# Patient Record
Sex: Male | Born: 1985 | Race: Black or African American | Hispanic: No | Marital: Single | Smoking: Never smoker
Health system: Southern US, Community
[De-identification: ages and names within clinical notes are randomized; demographics above are authoritative.]

## PROBLEM LIST (undated history)

## (undated) DIAGNOSIS — J45909 Unspecified asthma, uncomplicated: Secondary | ICD-10-CM

---

## 2009-07-13 ENCOUNTER — Emergency Department (HOSPITAL_COMMUNITY): Admission: EM | Admit: 2009-07-13 | Discharge: 2009-07-13 | Payer: Self-pay | Admitting: Emergency Medicine

## 2009-08-09 ENCOUNTER — Emergency Department (HOSPITAL_COMMUNITY): Admission: EM | Admit: 2009-08-09 | Discharge: 2009-08-09 | Payer: Self-pay | Admitting: Emergency Medicine

## 2009-08-18 ENCOUNTER — Emergency Department (HOSPITAL_COMMUNITY): Admission: EM | Admit: 2009-08-18 | Discharge: 2009-08-18 | Payer: Self-pay | Admitting: Emergency Medicine

## 2015-10-14 ENCOUNTER — Emergency Department (HOSPITAL_COMMUNITY): Payer: Self-pay

## 2015-10-14 ENCOUNTER — Encounter (HOSPITAL_COMMUNITY): Payer: Self-pay | Admitting: Emergency Medicine

## 2015-10-14 ENCOUNTER — Emergency Department (HOSPITAL_COMMUNITY)
Admission: EM | Admit: 2015-10-14 | Discharge: 2015-10-14 | Disposition: A | Discharge status: Home / Self Care | Payer: Self-pay | Attending: Emergency Medicine | Admitting: Emergency Medicine

## 2015-10-14 DIAGNOSIS — Y92410 Unspecified street and highway as the place of occurrence of the external cause: Secondary | ICD-10-CM | POA: Insufficient documentation

## 2015-10-14 DIAGNOSIS — J45909 Unspecified asthma, uncomplicated: Secondary | ICD-10-CM | POA: Insufficient documentation

## 2015-10-14 DIAGNOSIS — S39012A Strain of muscle, fascia and tendon of lower back, initial encounter: Secondary | ICD-10-CM | POA: Insufficient documentation

## 2015-10-14 DIAGNOSIS — Y999 Unspecified external cause status: Secondary | ICD-10-CM | POA: Insufficient documentation

## 2015-10-14 DIAGNOSIS — Y9389 Activity, other specified: Secondary | ICD-10-CM | POA: Insufficient documentation

## 2015-10-14 HISTORY — DX: Unspecified asthma, uncomplicated: J45.909

## 2015-10-14 MED ORDER — ALBUTEROL SULFATE HFA 108 (90 BASE) MCG/ACT IN AERS
2.0000 | INHALATION_SPRAY | RESPIRATORY_TRACT | Status: DC | PRN
  Administered 2015-10-14: 2 via RESPIRATORY_TRACT
  Filled 2015-10-14: qty 6.7

## 2015-10-14 MED ORDER — IBUPROFEN 800 MG PO TABS: 800.0000 mg | ORAL_TABLET | Freq: Three times a day (TID) | ORAL | Status: DC | PRN

## 2015-10-14 MED ORDER — CYCLOBENZAPRINE HCL 10 MG PO TABS: 10.0000 mg | ORAL_TABLET | Freq: Three times a day (TID) | ORAL | Status: DC | PRN
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
## 2015-10-14 NOTE — Discharge Instructions (Signed)
Return here as needed.  Follow-up with a primary care doctor.  Your x-rays did not show any abnormality

## 2016-12-25 DIAGNOSIS — Z23 Encounter for immunization: Secondary | ICD-10-CM | POA: Diagnosis not present

## 2017-03-21 ENCOUNTER — Ambulatory Visit (INDEPENDENT_AMBULATORY_CARE_PROVIDER_SITE_OTHER): Payer: 59 | Admitting: Emergency Medicine

## 2017-03-21 VITALS — BP 137/86 | HR 89 | Temp 97.9°F | Resp 17 | Ht 71.2 in | Wt 394.0 lb

## 2017-03-21 DIAGNOSIS — K122 Cellulitis and abscess of mouth: Secondary | ICD-10-CM | POA: Insufficient documentation

## 2017-03-21 MED ORDER — PREDNISONE 20 MG PO TABS: 40.0000 mg | ORAL_TABLET | Freq: Every day | ORAL | 0 refills | Status: AC

## 2017-03-21 NOTE — Patient Instructions (Addendum)
   IF you received an x-ray today, you will receive an invoice from [facility]. Please contact [facility] at [phone] with questions or concerns regarding your invoice.   IF you received labwork today, you will receive an invoice from [facility]. Please contact [facility] at [phone] with questions or concerns regarding your invoice.   Our billing staff will not be able to assist you with questions regarding bills from these companies.  You will be contacted with the lab results as soon as they are available. The fastest way to get your results is to activate your My Chart account. Instructions are located on the last page of this paperwork. If you have not heard from us regarding the results in 2 weeks, please contact this office.      Uvulitis Uvulitis is infection or inflammation of the uvula. The uvula is the small, finger-like piece of tissue that hangs down at the back of your throat. What are the causes? This condition may be caused by:  An infection in the mouth or throat. This is the most common cause.  Trauma to the uvula. Causes of trauma include burning your mouth and heavy snoring.  Fluid build-up (edema). Edema can be triggered be an allergic reaction. Uvulitis that is caused by edema is called Quincke disease.  Inhaling irritants, such as chemical agents, smoke, or steam. What are the signs or symptoms? Symptoms of this condition depend on the cause. Symptoms of uvulitis that is caused by infection include:  Red, swollen uvula.  Sore throat.  Fever.  Headache.  Swollen neck glands. Symptoms of uvulitis that is caused by trauma, edema, or irritation include:  Red, swollen uvula.  Sore throat.  Trouble swallowing.  Choking or gagging.  Trouble breathing. How is this diagnosed? This condition is diagnosed with a physical exam. You also may have tests, such as a throat culture and blood tests. How is this treated? Treatment for  this condition depends on the cause. Treatment may involve:  Antibiotic medicine. Antibiotics may be prescribed if a bacterial infection is the cause.  Steroid medicine. Steroids may be given if edema is the cause.  Surgery to remove part of the uvula (partial uvulectomy). Follow these instructions at home:  Rest as much as possible until your condition improves.  Drink enough fluid to keep your urine clear or pale yellow.  Take over-the-counter and prescription medicines only as told by your health care provider.  If you were prescribed an antibiotic medicine, take it as told by your health care provider. Do not stop taking the antibiotic even if you start to feel better.  Use a cool-mist humidifier to ease irritation in your throat.  While your throat is sore:  Eat soft foods or drink liquids, such as soup.  Gargle with a salt-water mixture 3-4 times per day or as needed. To make a salt-water mixture, completely dissolve -1 tsp of salt in 1 cup of warm water.  Keep all follow-up visits as told by your health care provider. This is important. Contact a health care provider if:  You have a fever.  You have trouble eating.  Your symptoms do not get better.  Your symptoms come back after treatment. Get help right away if:  You have trouble breathing.  You have trouble swallowing. This information is not intended to replace advice given to you by your health care provider. Make sure you discuss any questions you have with your health care provider. Document Released: 06/25/2004 Document Revised:   07/18/2016 Document Reviewed: 02/05/2015 Elsevier Interactive Patient Education  2017 Elsevier Inc.  

## 2017-03-21 NOTE — Progress Notes (Signed)
Lawrence James 31 y.o.   Chief Complaint  Patient presents with  . Sore Throat    2 months ago, not having one now. Sometimes Uvula swells up    HISTORY OF PRESENT ILLNESS: This is a 31 y.o. male with h/o recurrent episodes of uvulitis (spontaneously resolving) having one now for the past month; denies trouble swallowing or SOB; denies fever, chills, or any other significant symptoms.  HPI   Prior to Admission medications   Medication Sig Start Date End Date Taking? Authorizing Provider  albuterol (PROVENTIL HFA;VENTOLIN HFA) 108 (90 Base) MCG/ACT inhaler Inhale 2 puffs into the lungs every 4 (four) hours as needed for wheezing or shortness of breath.   Yes Historical Provider, MD  predniSONE (DELTASONE) 20 MG tablet Take 2 tablets (40 mg total) by mouth daily with breakfast. 03/21/17 03/26/17  Georgina Quint, MD    No Known Allergies  Patient Active Problem List   Diagnosis Date Noted  . Uvulitis 03/21/2017    Past Medical History:  Diagnosis Date  . Asthma     History reviewed. No pertinent surgical history.  Social History   Social History  . Marital status: Single    Spouse name: N/A  . Number of children: N/A  . Years of education: N/A   Occupational History  . Not on file.   Social History Main Topics  . Smoking status: Never Smoker  . Smokeless tobacco: Never Used  . Alcohol use Yes     Comment: occ  . Drug use: No  . Sexual activity: Not on file   Other Topics Concern  . Not on file   Social History Narrative  . No narrative on file    Family History  Problem Relation Age of Onset  . Arthritis Maternal Grandmother      Review of Systems  Constitutional: Negative.  Negative for chills, diaphoresis, fever and malaise/fatigue.  HENT: Positive for sore throat. Negative for congestion, nosebleeds and sinus pain.   Eyes: Negative.   Respiratory: Negative.  Negative for cough, shortness of breath and wheezing.   Cardiovascular: Negative.   Negative for chest pain and palpitations.  Gastrointestinal: Negative for abdominal pain, diarrhea, nausea and vomiting.  Genitourinary: Negative.   Musculoskeletal: Negative.   Skin: Negative for rash.  Neurological: Negative for dizziness and headaches.  Endo/Heme/Allergies: Negative.   All other systems reviewed and are negative.  Vitals:   03/21/17 1033  BP: 137/86  Pulse: 89  Resp: 17  Temp: 97.9 F (36.6 C)     Physical Exam  Constitutional: He is oriented to person, place, and time. He appears well-developed and well-nourished.  HENT:  Head: Normocephalic and atraumatic.  Mouth/Throat: Uvula is midline. Uvula swelling present. Posterior oropharyngeal edema and posterior oropharyngeal erythema present. No oropharyngeal exudate or tonsillar abscesses.  Eyes: Conjunctivae and EOM are normal. Pupils are equal, round, and reactive to light.  Neck: Normal range of motion. Neck supple. No JVD present.  Cardiovascular: Normal rate, regular rhythm and normal heart sounds.   Pulmonary/Chest: Effort normal and breath sounds normal.  Abdominal: Soft. There is no tenderness.  Musculoskeletal: Normal range of motion.  Lymphadenopathy:    He has no cervical adenopathy.  Neurological: He is alert and oriented to person, place, and time. No sensory deficit. He exhibits normal muscle tone.  Skin: Skin is warm and dry. Capillary refill takes less than 2 seconds.  Psychiatric: He has a normal mood and affect. His behavior is normal.  Vitals reviewed.  ASSESSMENT & PLAN:  Collie was seen today for sore throat.  Diagnoses and all orders for this visit:  Uvulitis Comments: recurrent  Other orders -     predniSONE (DELTASONE) 20 MG tablet; Take 2 tablets (40 mg total) by mouth daily with breakfast.    Patient Instructions       IF you received an x-ray today, you will receive an invoice from [facility]. Please contact [facility] at [phone] with  questions or concerns regarding your invoice.   IF you received labwork today, you will receive an invoice from [facility]. Please contact [facility] at [phone] with questions or concerns regarding your invoice.   Our billing staff will not be able to assist you with questions regarding bills from these companies.  You will be contacted with the lab results as soon as they are available. The fastest way to get your results is to activate your My Chart account. Instructions are located on the last page of this paperwork. If you have not heard from [place] regarding the results in 2 weeks, please contact this office.     Uvulitis Uvulitis is infection or inflammation of the uvula. The uvula is the small, finger-like piece of tissue that hangs down at the back of your throat. What are the causes? This condition may be caused by:  An infection in the mouth or throat. This is the most common cause.  Trauma to the uvula. Causes of trauma include burning your mouth and heavy snoring.  Fluid build-up (edema). Edema can be triggered be an allergic reaction. Uvulitis that is caused by edema is called Quincke disease.  Inhaling irritants, such as chemical agents, smoke, or steam. What are the signs or symptoms? Symptoms of this condition depend on the cause. Symptoms of uvulitis that is caused by infection include:  Red, swollen uvula.  Sore throat.  Fever.  Headache.  Swollen neck glands. Symptoms of uvulitis that is caused by trauma, edema, or irritation include:  Red, swollen uvula.  Sore throat.  Trouble swallowing.  Choking or gagging.  Trouble breathing. How is this diagnosed? This condition is diagnosed with a physical exam. You also may have tests, such as a throat culture and blood tests. How is this treated? Treatment for this condition depends on the cause. Treatment may involve:  Antibiotic medicine. Antibiotics may be prescribed if a bacterial infection is the  cause.  Steroid medicine. Steroids may be given if edema is the cause.  Surgery to remove part of the uvula (partial uvulectomy). Follow these instructions at home:  Rest as much as possible until your condition improves.  Drink enough fluid to keep your urine clear or pale yellow.  Take over-the-counter and prescription medicines only as told by your health care provider.  If you were prescribed an antibiotic medicine, take it as told by your health care provider. Do not stop taking the antibiotic even if you start to feel better.  Use a cool-mist humidifier to ease irritation in your throat.  While your throat is sore:  Eat soft foods or drink liquids, such as soup.  Gargle with a salt-water mixture 3-4 times per day or as needed. To make a salt-water mixture, completely dissolve -1 tsp of salt in 1 cup of warm water.  Keep all follow-up visits as told by your health care provider. This is important. Contact a health care provider if:  You have a fever.  You have trouble eating.  Your symptoms do not get better.  Your symptoms come back after treatment. Get help right away if:  You have trouble breathing.  You have trouble swallowing. This information is not intended to replace advice given to you by your health care provider. Make sure you discuss any questions you have with your health care provider. Document Released: 06/25/2004 Document Revised: 07/18/2016 Document Reviewed: 02/05/2015 Elsevier Interactive Patient Education  2017 Elsevier Inc.     Edwina Barth, MD [facility]

## 2019-11-30 ENCOUNTER — Other Ambulatory Visit: Payer: Self-pay

## 2019-11-30 ENCOUNTER — Emergency Department (HOSPITAL_BASED_OUTPATIENT_CLINIC_OR_DEPARTMENT_OTHER)
Admission: EM | Admit: 2019-11-30 | Discharge: 2019-11-30 | Disposition: A | Discharge status: Home / Self Care | Payer: No Typology Code available for payment source | Attending: Emergency Medicine | Admitting: Emergency Medicine

## 2019-11-30 ENCOUNTER — Encounter (HOSPITAL_BASED_OUTPATIENT_CLINIC_OR_DEPARTMENT_OTHER): Payer: Self-pay | Admitting: *Deleted

## 2019-11-30 ENCOUNTER — Emergency Department (HOSPITAL_BASED_OUTPATIENT_CLINIC_OR_DEPARTMENT_OTHER): Payer: No Typology Code available for payment source

## 2019-11-30 DIAGNOSIS — M79604 Pain in right leg: Secondary | ICD-10-CM | POA: Diagnosis present

## 2019-11-30 DIAGNOSIS — Y92410 Unspecified street and highway as the place of occurrence of the external cause: Secondary | ICD-10-CM | POA: Diagnosis not present

## 2019-11-30 DIAGNOSIS — Z79899 Other long term (current) drug therapy: Secondary | ICD-10-CM | POA: Insufficient documentation

## 2019-11-30 DIAGNOSIS — J45909 Unspecified asthma, uncomplicated: Secondary | ICD-10-CM | POA: Insufficient documentation

## 2019-11-30 DIAGNOSIS — Y990 Civilian activity done for income or pay: Secondary | ICD-10-CM | POA: Diagnosis not present

## 2019-11-30 DIAGNOSIS — M79631 Pain in right forearm: Secondary | ICD-10-CM | POA: Insufficient documentation

## 2019-11-30 DIAGNOSIS — Y939 Activity, unspecified: Secondary | ICD-10-CM | POA: Diagnosis not present

## 2019-11-30 DIAGNOSIS — T148XXA Other injury of unspecified body region, initial encounter: Secondary | ICD-10-CM | POA: Insufficient documentation

## 2019-11-30 MED ORDER — METHOCARBAMOL 500 MG PO TABS: 500.0000 mg | ORAL_TABLET | Freq: Two times a day (BID) | ORAL | 0 refills | Status: AC
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
## 2019-11-30 NOTE — ED Notes (Signed)
ED Provider at bedside. 

## 2019-11-30 NOTE — ED Notes (Signed)
Patient transported to X-ray 
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
## 2019-12-06 ENCOUNTER — Emergency Department (HOSPITAL_BASED_OUTPATIENT_CLINIC_OR_DEPARTMENT_OTHER)
Admission: EM | Admit: 2019-12-06 | Discharge: 2019-12-06 | Disposition: A | Discharge status: Home / Self Care | Payer: No Typology Code available for payment source | Attending: Emergency Medicine | Admitting: Emergency Medicine

## 2019-12-06 ENCOUNTER — Encounter (HOSPITAL_BASED_OUTPATIENT_CLINIC_OR_DEPARTMENT_OTHER): Payer: Self-pay | Admitting: Emergency Medicine

## 2019-12-06 ENCOUNTER — Emergency Department (HOSPITAL_BASED_OUTPATIENT_CLINIC_OR_DEPARTMENT_OTHER): Payer: 59 | Attending: Emergency Medicine

## 2019-12-06 ENCOUNTER — Other Ambulatory Visit: Payer: Self-pay

## 2019-12-06 DIAGNOSIS — S3992XA Unspecified injury of lower back, initial encounter: Secondary | ICD-10-CM | POA: Diagnosis present

## 2019-12-06 DIAGNOSIS — J45909 Unspecified asthma, uncomplicated: Secondary | ICD-10-CM | POA: Diagnosis not present

## 2019-12-06 DIAGNOSIS — Y9289 Other specified places as the place of occurrence of the external cause: Secondary | ICD-10-CM | POA: Diagnosis not present

## 2019-12-06 DIAGNOSIS — S39012A Strain of muscle, fascia and tendon of lower back, initial encounter: Secondary | ICD-10-CM | POA: Diagnosis not present

## 2019-12-06 DIAGNOSIS — Z79899 Other long term (current) drug therapy: Secondary | ICD-10-CM | POA: Insufficient documentation

## 2019-12-06 DIAGNOSIS — Y999 Unspecified external cause status: Secondary | ICD-10-CM | POA: Insufficient documentation

## 2019-12-06 DIAGNOSIS — Y9389 Activity, other specified: Secondary | ICD-10-CM | POA: Insufficient documentation

## 2019-12-06 MED ORDER — CYCLOBENZAPRINE HCL 10 MG PO TABS: 10.0000 mg | ORAL_TABLET | Freq: Two times a day (BID) | ORAL | 0 refills | Status: AC | PRN
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
## 2020-10-29 IMAGING — DX DG FOREARM 2V*R*
3 series · 3 of 3 positions shown · non-contrast
Comparison: None.

CLINICAL DATA: Pain

EXAM:
RIGHT FOREARM - 2 VIEW

[forearm ap]
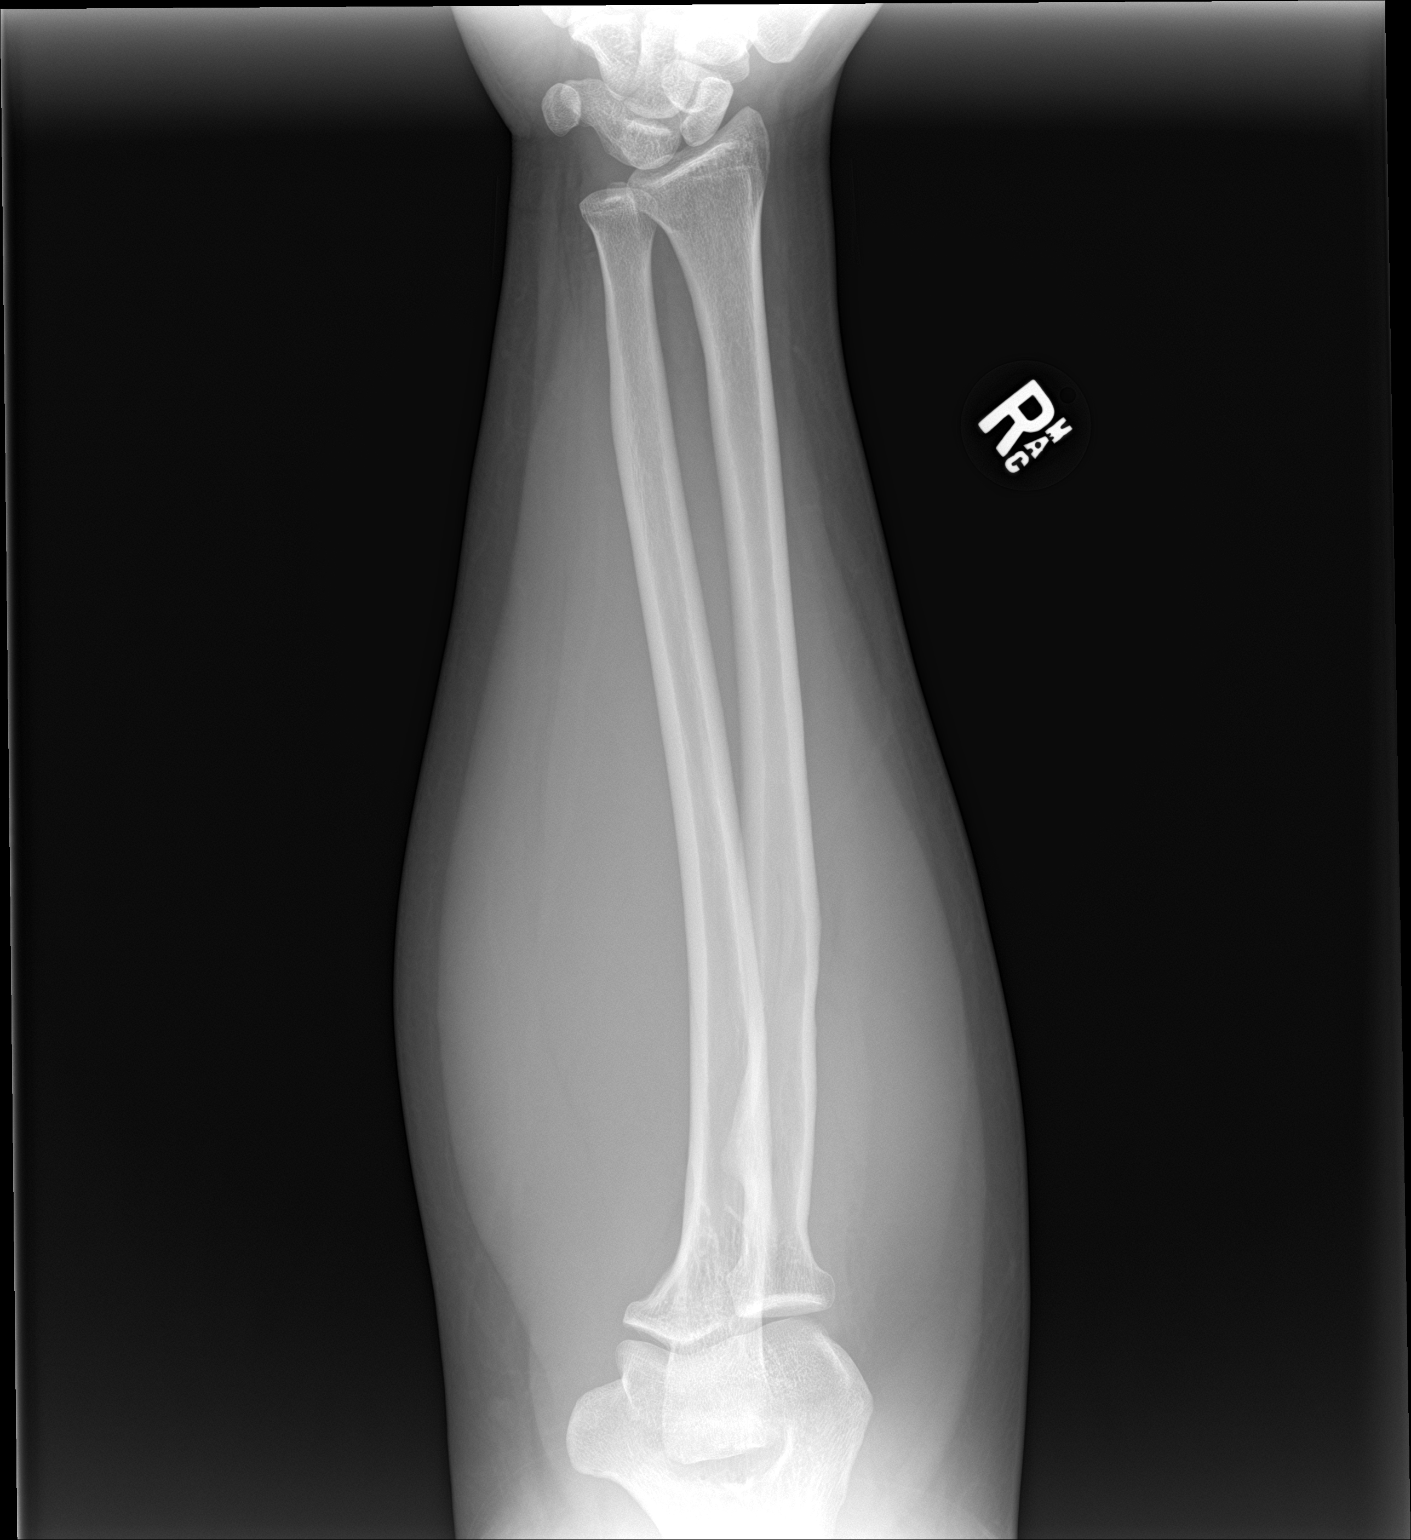

[forearm lat (1 of 2)]
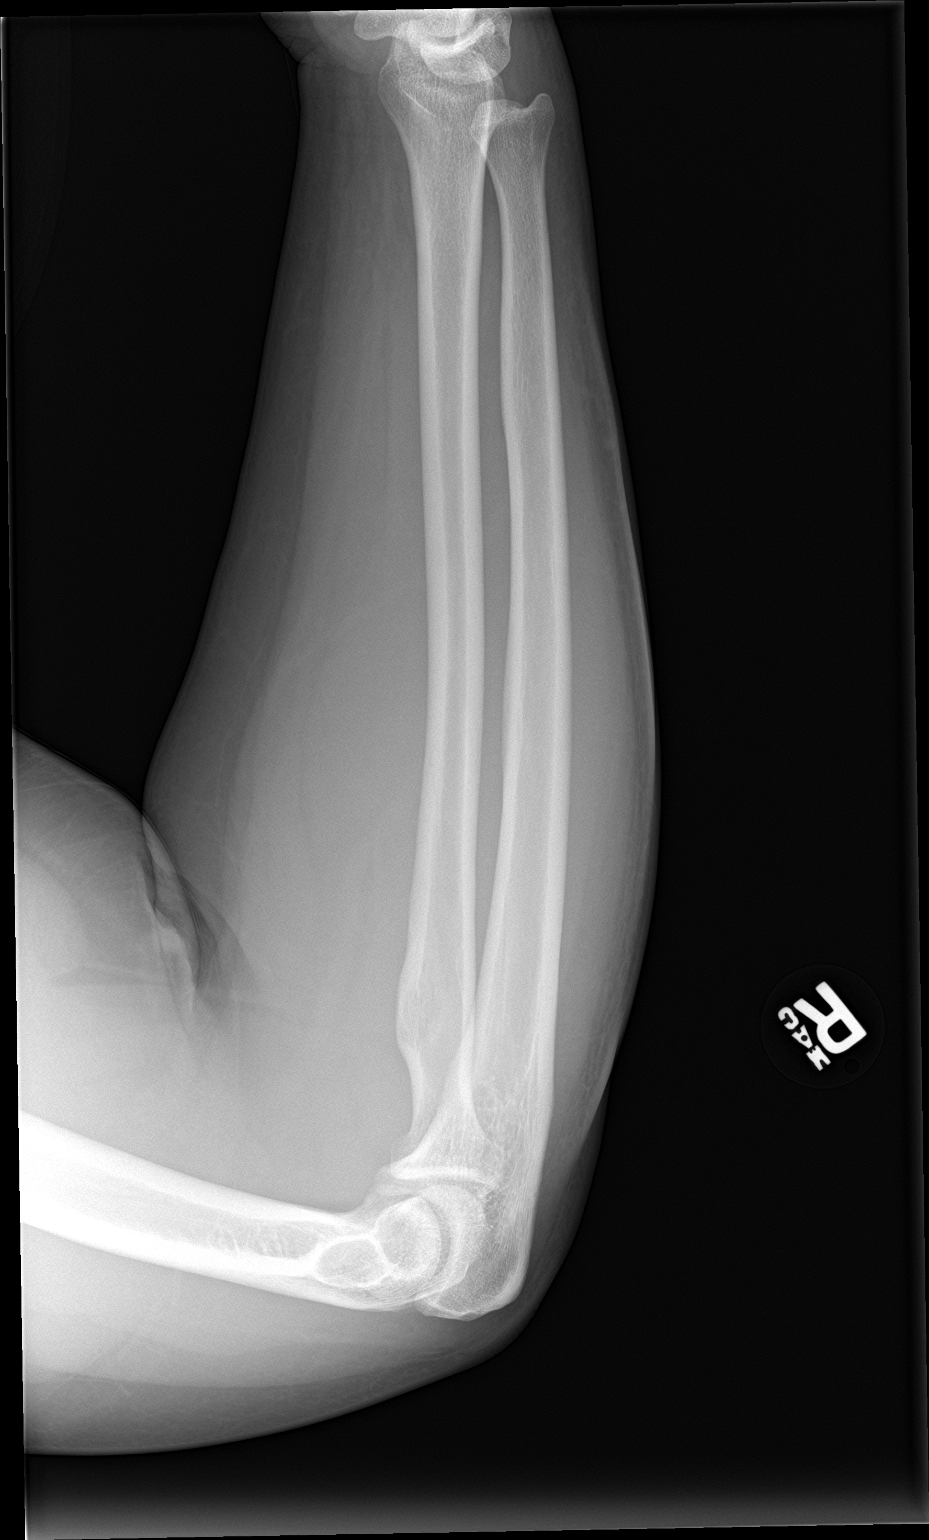

[forearm lat (2 of 2)]
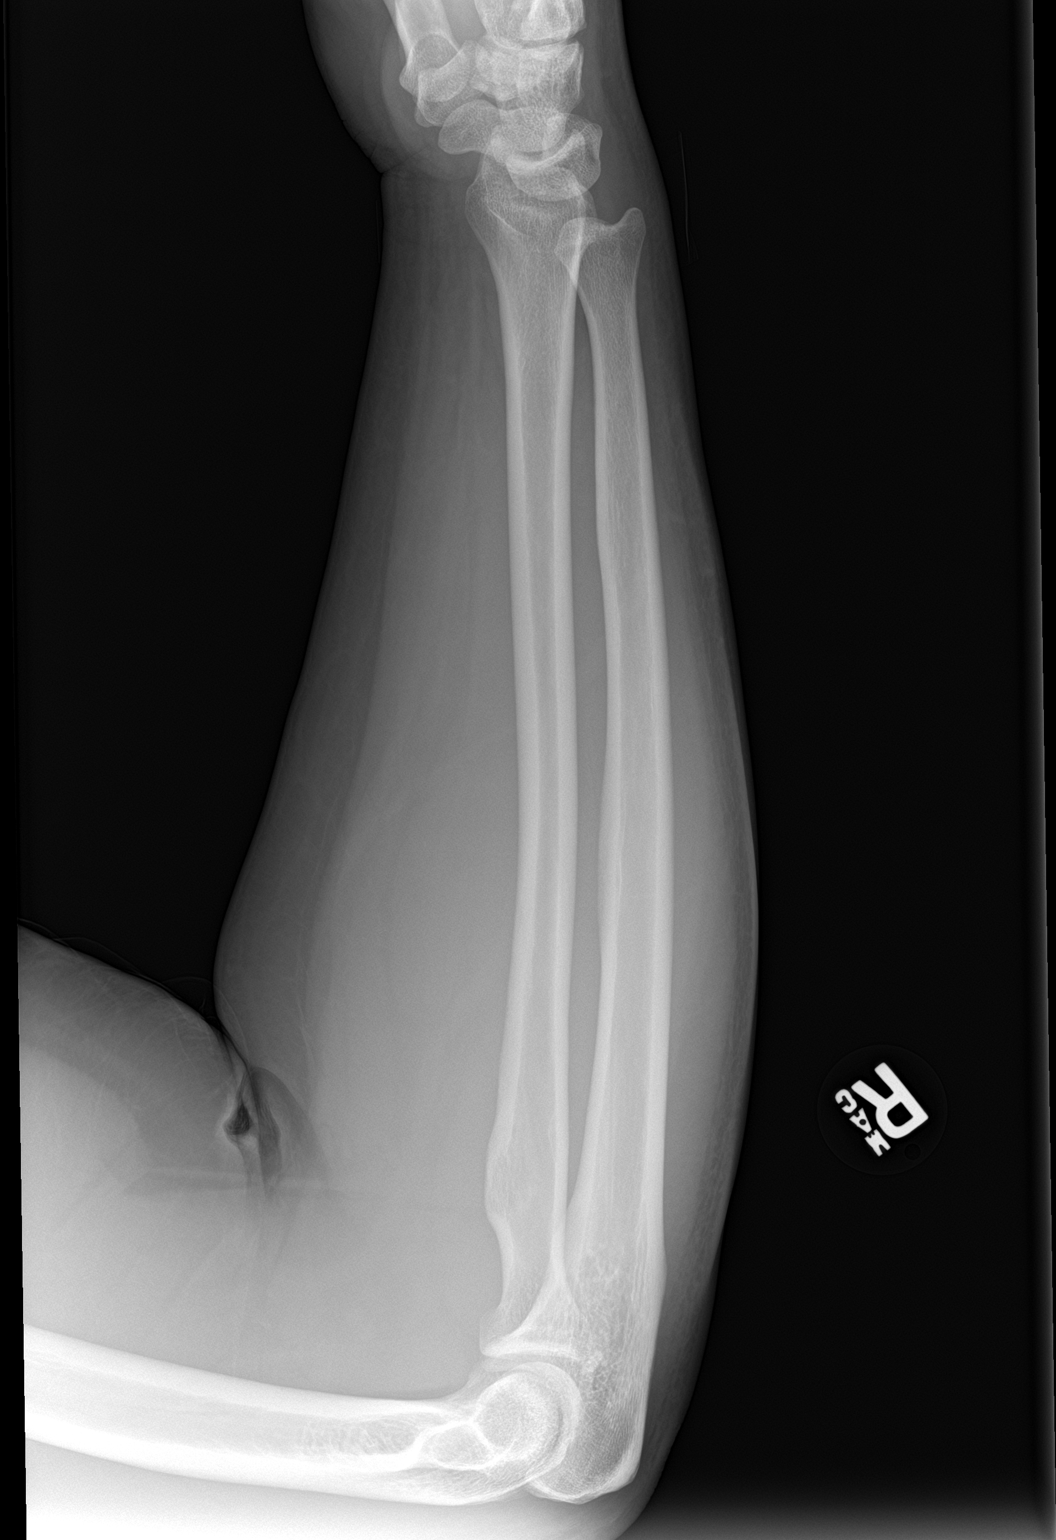

[3 of 3 positions shown; findings below may reference images not displayed]

FINDINGS: There is no evidence of fracture or other focal bone lesions. Soft
tissues are unremarkable.
IMPRESSION: Negative.

## 2021-09-18 ENCOUNTER — Other Ambulatory Visit: Payer: Self-pay

## 2021-09-18 ENCOUNTER — Ambulatory Visit: Payer: 59 | Admitting: Podiatry

## 2021-09-18 DIAGNOSIS — Z79899 Other long term (current) drug therapy: Secondary | ICD-10-CM | POA: Diagnosis not present

## 2021-09-18 DIAGNOSIS — B351 Tinea unguium: Secondary | ICD-10-CM | POA: Diagnosis not present

## 2021-09-19 LAB — HEPATIC FUNCTION PANEL
AG Ratio: 1.3 (calc) (ref 1.0–2.5)
ALT: 27 U/L (ref 9–46)
AST: 18 U/L (ref 10–40)
Albumin: 4 g/dL (ref 3.6–5.1)
Alkaline phosphatase (APISO): 64 U/L (ref 36–130)
Bilirubin, Direct: 0.1 mg/dL (ref 0.0–0.2)
Globulin: 3.2 g/dL (calc) (ref 1.9–3.7)
Indirect Bilirubin: 0.7 mg/dL (calc) (ref 0.2–1.2)
Total Bilirubin: 0.8 mg/dL (ref 0.2–1.2)
Total Protein: 7.2 g/dL (ref 6.1–8.1)

## 2021-09-21 ENCOUNTER — Other Ambulatory Visit: Payer: Self-pay | Admitting: Podiatry

## 2021-09-21 MED ORDER — TERBINAFINE HCL 250 MG PO TABS: 250.0000 mg | ORAL_TABLET | Freq: Every day | ORAL | 0 refills | Status: AC

## 2021-09-23 NOTE — Progress Notes (Signed)
  Subjective:  Patient ID: Lawrence James, male    DOB: Aug 15, 1986,  MRN: [id number]  Chief Complaint  Patient presents with   Nail Problem    Nail discoloration     35 y.o. male presents with the above complaint.  Patient presents with thickened elongated dystrophic toenails x10.  They are dystrophic and discolored and has been bothering him for quite some time.  He has not tried any treatment options.  He is tried some over-the-counter medication.  He would like to discuss oral medication at this time.  He has not seen and was prior to see me.  He denies any other acute complaints.  No pain on palpation to the nail.   Review of Systems: Negative except as noted in the HPI. Denies N/V/F/Ch.  Past Medical History:  Diagnosis Date   Asthma     Current Outpatient Medications:    albuterol (PROVENTIL HFA;VENTOLIN HFA) 108 (90 Base) MCG/ACT inhaler, Inhale 2 puffs into the lungs every 4 (four) hours as needed for wheezing or shortness of breath., Disp: , Rfl:    cyclobenzaprine (FLEXERIL) 10 MG tablet, Take 1 tablet (10 mg total) by mouth 2 (two) times daily as needed for muscle spasms., Disp: 20 tablet, Rfl: 0   terbinafine (LAMISIL) 250 MG tablet, Take 1 tablet (250 mg total) by mouth daily., Disp: 90 tablet, Rfl: 0  Social History   Tobacco Use  Smoking Status Never  Smokeless Tobacco Never    No Known Allergies Objective:  There were no vitals filed for this visit. There is no height or weight on file to calculate BMI. Constitutional Well developed. Well nourished.  Vascular Dorsalis pedis pulses palpable bilaterally. Posterior tibial pulses palpable bilaterally. Capillary refill normal to all digits.  No cyanosis or clubbing noted. Pedal hair growth normal.  Neurologic Normal speech. Oriented to person, place, and time. Epicritic sensation to light touch grossly present bilaterally.  Dermatologic Nails thickened elongated dystrophic discolored mycotic toenails x10.   Mild pain on palpation Skin within normal limits  Orthopedic: Normal joint ROM without pain or crepitus bilaterally. No visible deformities. No bony tenderness.   Radiographs: None Assessment:   1. Long-term use of high-risk medication   2. Nail fungus   3. Onychomycosis due to dermatophyte    Plan:  Patient was evaluated and treated and all questions answered.  Bilateral toenails/onychomycosis x10 -Educated the patient on the etiology of onychomycosis and various treatment options associated with improving the fungal load.  I explained to the patient that there is 3 treatment options available to treat the onychomycosis including topical, p.o., laser treatment.  Patient elected to undergo p.o. options with Lamisil/terbinafine therapy.  In order for me to start the medication therapy, I explained to the patient the importance of evaluating the liver and obtaining the liver function test.  Once the liver function test comes back normal I will start him on 12-month course of Lamisil therapy.  Patient understood all risk and would like to proceed with Lamisil therapy.  I have asked the patient to immediately stop the Lamisil therapy if she has any reactions to it and call the office or go to the emergency room right away.  Patient states understanding   No follow-ups on file.

## 2021-12-23 ENCOUNTER — Ambulatory Visit: Payer: 59 | Admitting: Podiatry

## 2021-12-25 ENCOUNTER — Other Ambulatory Visit: Payer: Self-pay | Admitting: Podiatry

## 2021-12-28 NOTE — Telephone Encounter (Signed)
Please advise
